# Patient Record
Sex: Male | Born: 1995 | Race: White | Hispanic: No | Marital: Single | Smoking: Current every day smoker
Health system: Southern US, Community
[De-identification: ages and names within clinical notes are randomized; demographics above are authoritative.]

## PROBLEM LIST (undated history)

## (undated) DIAGNOSIS — F1910 Other psychoactive substance abuse, uncomplicated: Secondary | ICD-10-CM

## (undated) DIAGNOSIS — F32A Depression, unspecified: Secondary | ICD-10-CM

## (undated) DIAGNOSIS — F419 Anxiety disorder, unspecified: Secondary | ICD-10-CM

## (undated) DIAGNOSIS — F329 Major depressive disorder, single episode, unspecified: Secondary | ICD-10-CM

---

## 2016-05-28 ENCOUNTER — Encounter (HOSPITAL_COMMUNITY): Payer: Self-pay | Admitting: *Deleted

## 2016-05-28 ENCOUNTER — Emergency Department (HOSPITAL_COMMUNITY)
Admission: EM | Admit: 2016-05-28 | Discharge: 2016-05-29 | Disposition: A | Discharge status: Home / Self Care | Payer: Medicaid Other | Attending: Physician Assistant | Admitting: Physician Assistant

## 2016-05-28 ENCOUNTER — Emergency Department (HOSPITAL_COMMUNITY): Payer: Medicaid Other

## 2016-05-28 DIAGNOSIS — T401X1A Poisoning by heroin, accidental (unintentional), initial encounter: Secondary | ICD-10-CM | POA: Diagnosis present

## 2016-05-28 HISTORY — DX: Other psychoactive substance abuse, uncomplicated: F19.10

## 2016-05-28 LAB — COMPREHENSIVE METABOLIC PANEL
ALK PHOS: 60 U/L (ref 38–126)
ALT: 45 U/L (ref 17–63)
AST: 50 U/L — ABNORMAL HIGH (ref 15–41)
Albumin: 4.1 g/dL (ref 3.5–5.0)
Anion gap: 7 (ref 5–15)
BILIRUBIN TOTAL: 0.4 mg/dL (ref 0.3–1.2)
BUN: 16 mg/dL (ref 6–20)
CO2: 27 mmol/L (ref 22–32)
Calcium: 8.7 mg/dL — ABNORMAL LOW (ref 8.9–10.3)
Chloride: 106 mmol/L (ref 101–111)
Creatinine, Ser: 0.93 mg/dL (ref 0.61–1.24)
GFR calc Af Amer: >60 mL/min (ref >60)
GFR calc non Af Amer: >60 mL/min (ref >60)
GLUCOSE: 150 mg/dL — AB (ref 65–99)
POTASSIUM: 3.4 mmol/L — AB (ref 3.5–5.1)
SODIUM: 140 mmol/L (ref 135–145)
TOTAL PROTEIN: 6.4 g/dL — AB (ref 6.5–8.1)

## 2016-05-28 LAB — RAPID URINE DRUG SCREEN, HOSP PERFORMED
AMPHETAMINES: NOT DETECTED
BARBITURATES: NOT DETECTED
BENZODIAZEPINES: NOT DETECTED
COCAINE: POSITIVE — AB
Opiates: POSITIVE — AB
TETRAHYDROCANNABINOL: POSITIVE — AB

## 2016-05-28 LAB — CBC
HEMATOCRIT: 41.9 % (ref 39.0–52.0)
HEMOGLOBIN: 14.3 g/dL (ref 13.0–17.0)
MCH: 29.1 pg (ref 26.0–34.0)
MCHC: 34.1 g/dL (ref 30.0–36.0)
MCV: 85.2 fL (ref 78.0–100.0)
Platelets: 158 10*3/uL (ref 150–400)
RBC: 4.92 MIL/uL (ref 4.22–5.81)
RDW: 12.9 % (ref 11.5–15.5)
WBC: 11.1 10*3/uL — ABNORMAL HIGH (ref 4.0–10.5)

## 2016-05-28 LAB — ETHANOL: Alcohol, Ethyl (B): <5 mg/dL (ref <5)

## 2016-05-28 LAB — SALICYLATE LEVEL: Salicylate Lvl: <7 mg/dL (ref 2.8–30.0)

## 2016-05-28 LAB — ACETAMINOPHEN LEVEL

## 2016-05-28 MED ORDER — NALOXONE HCL 4 MG/0.1ML NA LIQD
1.0000 | Freq: Once | NASAL | Status: DC
  Filled 2016-05-28: qty 4

## 2016-05-28 MED ORDER — SODIUM CHLORIDE 0.9 % IV BOLUS (SEPSIS)
1000.0000 mL | Freq: Once | INTRAVENOUS | Status: AC
  Administered 2016-05-28: 1000 mL via INTRAVENOUS

## 2016-05-28 NOTE — ED Provider Notes (Signed)
[facility]-EMER[facility]Y DEPT Provider Note   CSN: [id number] Arrival date & time: 05/28/16  1812     History   Chief Complaint Chief Complaint  Patient presents with  . Drug Overdose    HPI Karlyn AgeeJoseph Byrom is a 21 y.o. male.  HPI   Patient is 21 year old male presenting with drug overdose. Patient took on known amount of heroin and snorted it. Patient's girlfriend and found him agonal breathing difficulty , did chest depressions called EMS. On EMS arrival did valve bag mask mask and then gave 2 of Narcan which woke patient up.  Past Medical History:  Diagnosis Date  . Substance abuse     There are no active problems to display for this patient.   No past surgical history on file.     Home Medications    Prior to Admission medications   Not on File    Family History No family history on file.  Social History Social History  Substance Use Topics  . Smoking status: Not on file  . Smokeless tobacco: Not on file  . Alcohol use Not on file     Allergies   Patient has no known allergies.   Review of Systems Review of Systems  Constitutional: Negative for activity change.  Respiratory: Negative for shortness of breath.   Cardiovascular: Negative for chest pain.  Gastrointestinal: Negative for abdominal pain.     Physical Exam Updated Vital Signs BP 124/82 (BP Location: Right Arm)   Pulse 100   Temp 98.1 F (36.7 C) (Oral)   Resp 19   Ht 6\' 1"  (1.854 m)   Wt 79.4 kg (175 lb)   SpO2 98%   BMI 23.09 kg/m   Physical Exam  Constitutional: He is oriented to person, place, and time. He appears well-nourished.  HENT:  Head: Normocephalic.  Eyes: Conjunctivae and EOM are normal. Pupils are equal, round, and reactive to light.  Midsize pupils.  Cardiovascular: Normal rate and regular rhythm.   Pulmonary/Chest: Effort normal and breath sounds normal. No respiratory distress. He has no wheezes.  No signs of chest trauma.  Neurological: He is oriented to  person, place, and time.  Skin: Skin is warm and dry. He is not diaphoretic.  Psychiatric: He has a normal mood and affect. His behavior is normal.     ED Treatments / Results  Labs (all labs ordered are listed, but only abnormal results are displayed) Labs Reviewed  COMPREHENSIVE METABOLIC PANEL - Abnormal; Notable for the following:       Result Value   Potassium 3.4 (*)    Glucose, Bld 150 (*)    Calcium 8.7 (*)    Total Protein 6.4 (*)    AST 50 (*)    All other components within normal limits  ACETAMINOPHEN LEVEL - Abnormal; Notable for the following:    Acetaminophen (Tylenol), Serum <10 (*)    All other components within normal limits  CBC - Abnormal; Notable for the following:    WBC 11.1 (*)    All other components within normal limits  RAPID URINE DRUG SCREEN, HOSP PERFORMED - Abnormal; Notable for the following:    Opiates POSITIVE (*)    Cocaine POSITIVE (*)    Tetrahydrocannabinol POSITIVE (*)    All other components within normal limits  ETHANOL  SALICYLATE LEVEL    EKG  EKG Interpretation None       Radiology Dg Chest 2 View  Result Date: 05/28/2016 CLINICAL DATA:  Chest pain EXAM: CHEST  2 VIEW COMPARISON:  None. FINDINGS: Normal heart size. Normal mediastinal contour. No pneumothorax. No pleural effusion. Lungs appear clear, with no acute consolidative airspace disease and no pulmonary edema. IMPRESSION: No active cardiopulmonary disease. Electronically Signed   By: Delbert Phenix M.D.   On: 05/28/2016 20:58    Procedures Procedures (including critical care time)  Medications Ordered in ED Medications  sodium chloride 0.9 % bolus 1,000 mL (1,000 mLs Intravenous New Bag/Given 05/28/16 1957)     Initial Impression / Assessment and Plan / ED Course  I have reviewed the triage vital signs and the nursing notes.  Pertinent labs & imaging results that were available during my care of the patient were reviewed by me and considered in my medical  decision making (see chart for details).     Patient is a 21 year old male here status post overdose. Patient received 2 of Narcan prior to arrival. Patient is awake alert and conversant. We will observe for 4-6 hours.  10:49 PM   Patient observed for 4 hours, did not require any further narcan.  Will dsicharge home.  Final Clinical Impressions(s) / ED Diagnoses   Final diagnoses:  None    New Prescriptions New Prescriptions   No medications on file     Abelino Derrick, MD 05/28/16 2249

## 2016-05-28 NOTE — Discharge Instructions (Signed)
Please use Narcan given if you have another opiate overdose.  Substance Abuse Treatment Programs  Intensive Outpatient Programs [facility]     [address]      [place], [place]                   [phone]       [facility] [address] [place], [place] [phone]  [facility]     (Inpatient and outpatient)     [address]           [phone]    [facility] [phone] (Suboxone and Methadone)  [address]      [place], [place] [address]      [phone]       [address] [place], [place] [phone]  [facility] (Outpatient/Inpatient, [facility])    (insurance only) [phone]             [facility] (Groups & Residential) [place], [place] [phone]     [facility]     [address]     [place], [place]      [phone]       [facility] (for caregivers and family) [phone] [address] [place], [place] [phone]      Residential Treatment Programs [facility]      [address], [place], [place] [address]  [phone]       [facility] [address] [place], [place] [address] [phone]  [facility]        [phone]       [facility] [phone]  [facility]             [address]                                         [place], [place]                                                [phone] or [phone]                               [facility] [place] [address] [place], [address] [phone]  [facility]    [address]      [place], [place]     [phone]       [facility] [address] [place], [place] [phone]  [facility]   [address]     [place], [place] [address]     [phone]      Admissions: 8am-3pm M-F  [facility]idential  [facility] Services (RTS) [address] [place], [place] [phone]  [facility]: Residential Program [phone](90 Days)   [place], [place]      [phone] or [phone]     [facility]: [facility] [place], [place] (Walk in Hours over the weekend or by referral)  [facility] [address] [place], [place], [place] [address] [phone]  Crisis Mobile: [facility]:  [phone] (for crisis response 24 hours a day) [facility] Hotline:  [phone] Outpatient Psychiatry and Counseling  [facility]: Mobile Crisis Management 24 hours:  [phone]  [facility] of the [employer] sliding scale fee and walk in schedule: M-F 8am-12pm/1pm-3pm [place], [place] [address] [place], [place] [address] [phone]  [facility] (Formerly [facility] as [facility] [employer])- new patient walk-in appointments available Monday - Friday 8am -3pm.          [address] [place], [place] [address] [phone] or crisis line- [place] [facility]/ Intensive Outpatient Therapy Program [place], [place] [address] [place]      [phone] [address] [place], [place] [address]                 [place]   [facility] [phone]. [address] [place], [place] [address]   [employer] [facility] of Care          [address]ette Abraham  [place], [place] [address]       [phone]  [facility] [address], [place], [place] [address] [phone]  [facility]    [address] [place], [place] [address]     [place], [place] Joycelyn Man     [place] [address]     [phone]        [facility] [place] [address]  [facility]     [address], [place], [place], [place] [address] [place]     [address]     [place], [place] [address]     [phone]       [facility] [address] [place], [place] [address] [phone] Resources for Temporary Residential Assistance/Crisis [facility]) M-F 8am-3pm   [address] [place], [place] [address]   [phone] Services include: laundry, barbering, support groups, case management, phone  & computer access, showers, AA/NA mtgs, mental health/substance abuse nurse, job skills class, disability information, VA assistance, spiritual classes, etc.   [facility]   [address] [place]     [place]              [employer] (women and children)       [place]. [place], [place] [address] [phone] [email or web address] for application and process Application Required  [facility]   [address]    [place] [address]     [phone]                    [place], [place] [address] [phone]([email or web address] application appt.) Application Required  [employer] (women only)    [place], [place]  [place]      Intake starts 6pm daily Need valid ID, SSC, & Police report [employer] [address] [place], [place] [phone] Application Required  [employer] (men only)     [place].      [place], [place]       [place] (Pregnant women only) [address] [place], [place]  [facility]      [address]      [place], [place] [address]     [phone]             [facility] [address] [place] [address] [place], [place] 90 day commitment/SA/Application process  [facility](men only)     [address]     [place], [place]       Check-in at [facility] [address] [place], [place] [address] [phone] Men/Women/Women and Children must be there by 7 pm  [place], [place]

## 2016-05-28 NOTE — ED Notes (Signed)
Bed: RESB Expected date:  Expected time:  Means of arrival:  Comments: EMS- heroin OD 

## 2016-05-28 NOTE — ED Triage Notes (Signed)
[facility]  Patient found down after ingestion of heroin.  Patient was 37 days clean.  Patient was agonal and EMS gave narcan.  CPR was initiated and pulses were reestablished. Patient is alert and oriented x4 on arrival to the ED.

## 2016-06-28 ENCOUNTER — Emergency Department (HOSPITAL_COMMUNITY)
Admission: EM | Admit: 2016-06-28 | Discharge: 2016-06-29 | Disposition: A | Discharge status: Home / Self Care | Payer: Self-pay | Attending: Emergency Medicine | Admitting: Emergency Medicine

## 2016-06-28 ENCOUNTER — Encounter (HOSPITAL_COMMUNITY): Payer: Self-pay | Admitting: Emergency Medicine

## 2016-06-28 DIAGNOSIS — F1910 Other psychoactive substance abuse, uncomplicated: Secondary | ICD-10-CM

## 2016-06-28 DIAGNOSIS — R45851 Suicidal ideations: Secondary | ICD-10-CM

## 2016-06-28 DIAGNOSIS — F1920 Other psychoactive substance dependence, uncomplicated: Secondary | ICD-10-CM | POA: Diagnosis present

## 2016-06-28 DIAGNOSIS — F1120 Opioid dependence, uncomplicated: Secondary | ICD-10-CM

## 2016-06-28 DIAGNOSIS — F1210 Cannabis abuse, uncomplicated: Secondary | ICD-10-CM | POA: Insufficient documentation

## 2016-06-28 DIAGNOSIS — F17210 Nicotine dependence, cigarettes, uncomplicated: Secondary | ICD-10-CM | POA: Insufficient documentation

## 2016-06-28 DIAGNOSIS — F1994 Other psychoactive substance use, unspecified with psychoactive substance-induced mood disorder: Secondary | ICD-10-CM

## 2016-06-28 DIAGNOSIS — F1494 Cocaine use, unspecified with cocaine-induced mood disorder: Secondary | ICD-10-CM

## 2016-06-28 DIAGNOSIS — F1110 Opioid abuse, uncomplicated: Secondary | ICD-10-CM | POA: Insufficient documentation

## 2016-06-28 DIAGNOSIS — F1414 Cocaine abuse with cocaine-induced mood disorder: Secondary | ICD-10-CM | POA: Insufficient documentation

## 2016-06-28 HISTORY — DX: Depression, unspecified: F32.A

## 2016-06-28 HISTORY — DX: Anxiety disorder, unspecified: F41.9

## 2016-06-28 HISTORY — DX: Major depressive disorder, single episode, unspecified: F32.9

## 2016-06-28 LAB — COMPREHENSIVE METABOLIC PANEL
ALBUMIN: 4.2 g/dL (ref 3.5–5.0)
ALK PHOS: 45 U/L (ref 38–126)
ALT: 23 U/L (ref 17–63)
AST: 24 U/L (ref 15–41)
Anion gap: 5 (ref 5–15)
BILIRUBIN TOTAL: 1 mg/dL (ref 0.3–1.2)
BUN: 14 mg/dL (ref 6–20)
CALCIUM: 9.3 mg/dL (ref 8.9–10.3)
CO2: 31 mmol/L (ref 22–32)
CREATININE: 0.85 mg/dL (ref 0.61–1.24)
Chloride: 106 mmol/L (ref 101–111)
GFR calc Af Amer: >60 mL/min (ref >60)
GLUCOSE: 120 mg/dL — AB (ref 65–99)
Potassium: 3.6 mmol/L (ref 3.5–5.1)
Sodium: 142 mmol/L (ref 135–145)
TOTAL PROTEIN: 6.3 g/dL — AB (ref 6.5–8.1)

## 2016-06-28 LAB — RAPID URINE DRUG SCREEN, HOSP PERFORMED
Amphetamines: NOT DETECTED
BARBITURATES: NOT DETECTED
BENZODIAZEPINES: NOT DETECTED
Cocaine: POSITIVE — AB
Opiates: POSITIVE — AB
Tetrahydrocannabinol: POSITIVE — AB

## 2016-06-28 LAB — ACETAMINOPHEN LEVEL: Acetaminophen (Tylenol), Serum: <10 ug/mL — ABNORMAL LOW (ref 10–30)

## 2016-06-28 LAB — ETHANOL

## 2016-06-28 LAB — CBC
HEMATOCRIT: 45 % (ref 39.0–52.0)
Hemoglobin: 15.3 g/dL (ref 13.0–17.0)
MCH: 29.2 pg (ref 26.0–34.0)
MCHC: 34 g/dL (ref 30.0–36.0)
MCV: 85.9 fL (ref 78.0–100.0)
Platelets: 177 10*3/uL (ref 150–400)
RBC: 5.24 MIL/uL (ref 4.22–5.81)
RDW: 12.9 % (ref 11.5–15.5)
WBC: 11 10*3/uL — AB (ref 4.0–10.5)

## 2016-06-28 LAB — SALICYLATE LEVEL: Salicylate Lvl: <7 mg/dL (ref 2.8–30.0)

## 2016-06-28 MED ORDER — CLONIDINE HCL 0.1 MG PO TABS
0.1000 mg | ORAL_TABLET | Freq: Four times a day (QID) | ORAL | Status: DC
  Administered 2016-06-28: 0.1 mg via ORAL
  Filled 2016-06-28: qty 1

## 2016-06-28 MED ORDER — ONDANSETRON HCL 4 MG PO TABS: 4.0000 mg | ORAL_TABLET | Freq: Three times a day (TID) | ORAL | Status: DC | PRN

## 2016-06-28 MED ORDER — HYDROXYZINE HCL 25 MG PO TABS: 25.0000 mg | ORAL_TABLET | Freq: Four times a day (QID) | ORAL | Status: DC | PRN

## 2016-06-28 MED ORDER — GABAPENTIN 300 MG PO CAPS
300.0000 mg | ORAL_CAPSULE | Freq: Three times a day (TID) | ORAL | Status: DC
  Administered 2016-06-28 – 2016-06-29 (×4): 300 mg via ORAL
  Filled 2016-06-28 (×4): qty 1

## 2016-06-28 MED ORDER — ONDANSETRON 4 MG PO TBDP: 4.0000 mg | ORAL_TABLET | Freq: Four times a day (QID) | ORAL | Status: DC | PRN

## 2016-06-28 MED ORDER — CLONIDINE HCL 0.1 MG PO TABS: 0.1000 mg | ORAL_TABLET | ORAL | Status: DC

## 2016-06-28 MED ORDER — DICYCLOMINE HCL 20 MG PO TABS: 20.0000 mg | ORAL_TABLET | Freq: Four times a day (QID) | ORAL | Status: DC | PRN

## 2016-06-28 MED ORDER — IBUPROFEN 200 MG PO TABS: 600.0000 mg | ORAL_TABLET | Freq: Three times a day (TID) | ORAL | Status: DC | PRN

## 2016-06-28 MED ORDER — ACETAMINOPHEN 325 MG PO TABS: 650.0000 mg | ORAL_TABLET | ORAL | Status: DC | PRN

## 2016-06-28 MED ORDER — METHOCARBAMOL 500 MG PO TABS: 500.0000 mg | ORAL_TABLET | Freq: Three times a day (TID) | ORAL | Status: DC | PRN

## 2016-06-28 MED ORDER — NAPROXEN 500 MG PO TABS: 500.0000 mg | ORAL_TABLET | Freq: Two times a day (BID) | ORAL | Status: DC | PRN

## 2016-06-28 MED ORDER — CLONIDINE HCL 0.1 MG PO TABS: 0.1000 mg | ORAL_TABLET | Freq: Every day | ORAL | Status: DC

## 2016-06-28 MED ORDER — LOPERAMIDE HCL 2 MG PO CAPS
2.0000 mg | ORAL_CAPSULE | ORAL | Status: DC | PRN
Start: 2016-06-28 — End: 2016-06-29

## 2016-06-28 MED ORDER — CLONIDINE HCL 0.1 MG PO TABS
0.1000 mg | ORAL_TABLET | Freq: Four times a day (QID) | ORAL | Status: DC
  Administered 2016-06-28 – 2016-06-29 (×3): 0.1 mg via ORAL
  Filled 2016-06-28 (×3): qty 1

## 2016-06-28 NOTE — ED Provider Notes (Signed)
[facility] DEPT Provider Note   CSN: [id number] Arrival date & time: 06/28/16  1010     History   Chief Complaint Chief Complaint  Patient presents with  . Suicidal  . Detox    HPI Greg Barber is a 21 y.o. male.  HPI Patient reports he uses both cocaine and heroin. He reports that he is sick of having problems with drug abuse. He reports when he stops using and tries to get off of it he becomes very depressed and start to think about killing himself. He denies any specific plan. He reports he thinks is mostly "drug-induced depression". He reports he is currently living in a halfway house. Past Medical History:  Diagnosis Date  . Anxiety   . Depression   . Substance abuse     There are no active problems to display for this patient.   History reviewed. No pertinent surgical history.     Home Medications    Prior to Admission medications   Not on File    Family History History reviewed. No pertinent family history.  Social History Social History  Substance Use Topics  . Smoking status: Current Every Day Smoker    Packs/day: 1.00    Types: Cigarettes  . Smokeless tobacco: Never Used  . Alcohol use No     Allergies   Azithromycin   Review of Systems Review of Systems 10 Systems reviewed and are negative for acute change except as noted in the HPI.  Physical Exam Updated Vital Signs BP 118/68 (BP Location: Left Arm)   Pulse 77   Temp 98.4 F (36.9 C) (Oral)   Resp 16   Ht 6\' 1"  (1.854 m)   Wt 78.9 kg (174 lb)   SpO2 96%   BMI 22.96 kg/m   Physical Exam  Constitutional: He is oriented to person, place, and time. He appears well-developed and well-nourished.  HENT:  Head: Normocephalic and atraumatic.  Nose: Nose normal.  Mouth/Throat: Oropharynx is clear and moist.  Eyes: Conjunctivae and EOM are normal.  Neck: Neck supple.  Cardiovascular: Normal rate, regular rhythm and intact distal pulses.   No murmur  heard. Pulmonary/Chest: Effort normal and breath sounds normal. No respiratory distress.  Abdominal: Soft. There is no tenderness.  Musculoskeletal: Normal range of motion. He exhibits no edema, tenderness or deformity.  Lower extremities are very good condition. No peripheral edema. No areas of cellulitis or abscess on the extremities.  Neurological: He is alert and oriented to person, place, and time. No cranial nerve deficit. He exhibits normal muscle tone. Coordination normal.  Skin: Skin is warm and dry.  Psychiatric: He has a normal mood and affect.  Nursing note and vitals reviewed.    ED Treatments / Results  Labs (all labs ordered are listed, but only abnormal results are displayed) Labs Reviewed  COMPREHENSIVE METABOLIC PANEL - Abnormal; Notable for the following:       Result Value   Glucose, Bld 120 (*)    Total Protein 6.3 (*)    All other components within normal limits  ACETAMINOPHEN LEVEL - Abnormal; Notable for the following:    Acetaminophen (Tylenol), Serum <10 (*)    All other components within normal limits  CBC - Abnormal; Notable for the following:    WBC 11.0 (*)    All other components within normal limits  RAPID URINE DRUG SCREEN, HOSP PERFORMED - Abnormal; Notable for the following:    Opiates POSITIVE (*)    Cocaine POSITIVE (*)  Tetrahydrocannabinol POSITIVE (*)    All other components within normal limits  ETHANOL  SALICYLATE LEVEL    EKG  EKG Interpretation None       Radiology No results found.  Procedures Procedures (including critical care time)  Medications Ordered in ED Medications  acetaminophen (TYLENOL) tablet 650 mg (not administered)  ibuprofen (ADVIL,MOTRIN) tablet 600 mg (not administered)  ondansetron (ZOFRAN) tablet 4 mg (not administered)     Initial Impression / Assessment and Plan / ED Course  I have reviewed the triage vital signs and the nursing notes.  Pertinent labs & imaging results that were available  during my care of the patient were reviewed by me and considered in my medical decision making (see chart for details).     Final Clinical Impressions(s) / ED Diagnoses   Final diagnoses:  Cocaine-induced mood disorder (HCC)  Suicidal ideation  Polysubstance abuse  Heroin abuse   Patient is medically cleared. At this time his mental status is clear. Is not showing any signs of acute psychosis or active withdrawal. Appropriate for disposition per TTS consult. New Prescriptions New Prescriptions   No medications on file     Arby Barrette, MD 06/28/16 [phone]

## 2016-06-28 NOTE — ED Notes (Signed)
Pt admitted to room #39. Pt reports wanting rehab for "drug addiction." Pt endorsing SI. Not endorsing HI/AVH. Encouragement and support provided. Special checks q 15 mins in place for safety, video monitoring in place. Will continue to monitor.

## 2016-06-28 NOTE — BH Assessment (Signed)
[facility] Assessment Progress Note   Case was staffed with Lord DNP who recommended patient be re-evaluated in the a.m.    

## 2016-06-28 NOTE — ED Notes (Signed)
SBAR Report received from previous nurse. Pt received calm and visible on unit. Pt asleep and gave no answers to assessment questions related to  current SI/ HI, A/V H, depression, anxiety, or pain at this time, and appears otherwise stable and free of distress. Pt reminded of camera surveillance, q 15 min rounds, and rules of the milieu. Will continue to assess.

## 2016-06-28 NOTE — BH Assessment (Addendum)
Assessment Note  Greg Barber is an 21 y.o. male that presents this date with thoughts of self harm but denies any plan or intent. Patient is a poor historian and is vague in reference to his plan of self harm. Patient reports he uses both cocaine and heroin. Patient states he has been using 1 gram of Heroin daily for the last year reporting withdrawals to include tremors and agitation. Patient reports last use on 06/27/16 when patient stated he used one gram. Patient denies any IV drug use and reports he currently "snorts" the heroin. Patient also reports daily cocaine use stating he uses 1/2 gram of cocaine (crack) daily for the last month. Patient reports last use on 06/27/16 when patient reported he used 1 gram. Patient states he has been maintaining his sobriety for the last four months while residing at a halfway ([facility]). Patient denies any other illicit SA use. Patient denies any other illicit SA use. Patient denies any prior inpatient admissions or previous attempts/gestures at self harm. Patient denies any OP treatment or prior MH diagnosis although reports ongoing depression with symptoms to include excessive guilt and feelings of being hopelessness. Patient cannot identify any current stressors with his relapse. Patient did state that he was living in a high risk environment conducive to relapse. Patient denies any H/I or AVH. Patient is oriented to time/place but is very drowsy during assessment being partially impaired. Patient is requesting a voluntary admission to assist with detox and stabilization. Per notes, patient reports when he stops using and tries to get off of it he becomes very depressed and start to think about killing himself. He denies any specific plan. He reports he thinks is mostly "drug-induced depression". He reports he is currently living in a halfway house. Pt presents with SI and detox states"sick and tired of being sick and tired" I could hurt myself if I don't get  the help I need and detox. I just need somewhere to be. Case was staffed with Shaune Pollack DNP who recommended patient be re-evaluated in the a.m.  Diagnosis: MDD recurrent without psychotic features, Polysubstance abuse severe  Past Medical History:  Past Medical History:  Diagnosis Date  . Anxiety   . Depression   . Substance abuse     History reviewed. No pertinent surgical history.  Family History: History reviewed. No pertinent family history.  Social History:  reports that he has been smoking Cigarettes.  He has been smoking about 1.00 pack per day. He has never used smokeless tobacco. He reports that he uses drugs, including Cocaine. He reports that he does not drink alcohol.  Additional Social History:  Alcohol / Drug Use Pain Medications: See MAR Prescriptions: See MAR Over the Counter: See MAR History of alcohol / drug use?: Yes Longest period of sobriety (when/how long): 2 months Negative Consequences of Use: Personal relationships Withdrawal Symptoms: Agitation, Tremors Substance #1 Name of Substance 1: Heroin 1 - Age of First Use: 19 1 - Amount (size/oz): 1/2 a gram 1 - Frequency: Daily 1 - Duration: Last year  1 - Last Use / Amount: 06/27/16 1/2 gram Substance #2 Name of Substance 2: Cocaine (Crack) 2 - Age of First Use: 19 2 - Amount (size/oz): 1/3 gram 2 - Frequency: Daily 2 - Duration: Last year 2 - Last Use / Amount: 06/27/16 1/2 gram  CIWA: CIWA-Ar BP: 120/62 Pulse Rate: 79 COWS:    Allergies:  Allergies  Allergen Reactions  . Azithromycin Hives    Home  Medications:  (Not in a hospital admission)  OB/GYN Status:  No LMP for male patient.  General Assessment Data Location of Assessment: [facility] TTS Assessment: In system Is this a Tele or Face-to-Face Assessment?: Face-to-Face Is this an Initial Assessment or a Re-assessment for this encounter?: Initial Assessment Marital status: Single Maiden name: NA Is patient pregnant?: No Pregnancy Status:  No Living Arrangements: Other (Comment) ([facility]) Can pt return to current living arrangement?: Yes Admission Status: Voluntary Is patient capable of signing voluntary admission?: Yes Referral Source: Self/Family/Friend Insurance type: Medicaid  Medical Screening Exam [facility] Walk-in ONLY) Medical Exam completed: Yes  Crisis Care Plan Living Arrangements: Other (Comment) ([facility]) Legal Guardian:  (NA) Name of Psychiatrist: None Name of Therapist: None  Education Status Is patient currently in school?: No Current Grade:  (NA) Highest grade of school patient has completed:  (12) Name of school:  (NA) Contact person:  (NA)  Risk to self with the past 6 months Suicidal Ideation: Yes-Currently Present Has patient been a risk to self within the past 6 months prior to admission? : No Suicidal Intent: Yes-Currently Present Has patient had any suicidal intent within the past 6 months prior to admission? : No Is patient at risk for suicide?: No Suicidal Plan?: No Has patient had any suicidal plan within the past 6 months prior to admission? : No Access to Means: No What has been your use of drugs/alcohol within the last 12 months?: Current use Previous Attempts/Gestures: No How many times?: 0 Other Self Harm Risks: NA Triggers for Past Attempts: Unknown Intentional Self Injurious Behavior: None Family Suicide History: No Recent stressful life event(s): Other (Comment) (Recent relapse) Persecutory voices/beliefs?: No Depression: Yes Depression Symptoms: Guilt, Feeling worthless/self pity Substance abuse history and/or treatment for substance abuse?: Yes Suicide prevention information given to non-admitted patients: Not applicable  Risk to Others within the past 6 months Homicidal Ideation: No Does patient have any lifetime risk of violence toward others beyond the six months prior to admission? : No Thoughts of Harm to Others: No Current Homicidal Intent:  No Current Homicidal Plan: No Access to Homicidal Means: No Identified Victim: NA History of harm to others?: No Assessment of Violence: None Noted Violent Behavior Description: NA Does patient have access to weapons?: No Criminal Charges Pending?: No Does patient have a court date: No Is patient on probation?: No  Psychosis Hallucinations: None noted Delusions: None noted  Mental Status Report Appearance/Hygiene: In scrubs Eye Contact: Poor Motor Activity: Unremarkable Speech: Slow, Slurred Level of Consciousness: Drowsy Mood: Depressed Affect: Blunted, Depressed Anxiety Level: Minimal Thought Processes: Coherent Judgement: Partial Orientation: Place Obsessive Compulsive Thoughts/Behaviors: None  Cognitive Functioning Concentration: Decreased Memory: Recent Intact IQ: Average Insight: Fair Impulse Control: Poor Appetite: Fair Weight Loss: 0 Weight Gain: 0 Sleep: Decreased Total Hours of Sleep: 5 Vegetative Symptoms: None  ADLScreening [facility] Assessment Services) Patient's cognitive ability adequate to safely complete daily activities?: Yes Patient able to express need for assistance with ADLs?: Yes Independently performs ADLs?: Yes (appropriate for developmental age)  Prior Inpatient Therapy Prior Inpatient Therapy: No Prior Therapy Dates: NA Prior Therapy Facilty/Provider(s): NA Reason for Treatment: NA  Prior Outpatient Therapy Prior Outpatient Therapy: No Prior Therapy Dates: NA Prior Therapy Facilty/Provider(s): NA Reason for Treatment: NA Does patient have an ACCT team?: No Does patient have Intensive In-House Services?  : No Does patient have [facility] services? : No Does patient have P4CC services?: No  ADL Screening (condition at time of admission)  Patient's cognitive ability adequate to safely complete daily activities?: Yes Is the patient deaf or have difficulty hearing?: No Does the patient have difficulty seeing, even when wearing  glasses/contacts?: No Does the patient have difficulty concentrating, remembering, or making decisions?: No Patient able to express need for assistance with ADLs?: Yes Does the patient have difficulty dressing or bathing?: No Independently performs ADLs?: Yes (appropriate for developmental age) Does the patient have difficulty walking or climbing stairs?: No Weakness of Legs: None Weakness of Arms/Hands: None  Home Assistive Devices/Equipment Home Assistive Devices/Equipment: None  Therapy Consults (therapy consults require a physician order) PT Evaluation Needed: No OT Evalulation Needed: No SLP Evaluation Needed: No Abuse/Neglect Assessment (Assessment to be complete while patient is alone) Physical Abuse: Denies Verbal Abuse: Denies Sexual Abuse: Denies Exploitation of patient/patient's resources: Denies Self-Neglect: Denies Values / Beliefs Cultural Requests During Hospitalization: None Spiritual Requests During Hospitalization: None Consults Spiritual Care Consult Needed: No Social Work Consult Needed: No Merchant navy officer (For Healthcare) Does Patient Have a Medical Advance Directive?: No Would patient like information on creating a medical advance directive?: No - Patient declined    Additional Information 1:1 In Past 12 Months?: No CIRT Risk: No Elopement Risk: No Does patient have medical clearance?: Yes     Disposition: Case was staffed with Shaune Pollack DNP who recommended patient be re-evaluated in the a.m.   Disposition Initial Assessment Completed for this Encounter: Yes Disposition of Patient: Other dispositions Other disposition(s): Other (Comment) (Re-evaluate in the a.m.)  On Site Evaluation by:   Reviewed with Physician:    Alfredia Ferguson 06/28/2016 2:56 PM

## 2016-06-28 NOTE — ED Triage Notes (Signed)
Pt presents with SI and Detox states"sick and tired of being sick and tired" I could hurt myself if I don't get the help I need and detox. I just need somewhere to be. Lorette Angavid Psych Counselor at bedside during triage.  Uses dope and snorting heroin, smoking rock. Denies IV drug use, or HI. He states he was in a half way house and relapsed. Denies a formulated plan for SI. Pt NAD at triage.

## 2016-06-28 NOTE — ED Notes (Signed)
ED Provider at bedside. 

## 2016-06-29 ENCOUNTER — Encounter (HOSPITAL_COMMUNITY): Payer: Self-pay | Admitting: Registered Nurse

## 2016-06-29 DIAGNOSIS — Z79899 Other long term (current) drug therapy: Secondary | ICD-10-CM

## 2016-06-29 DIAGNOSIS — F17210 Nicotine dependence, cigarettes, uncomplicated: Secondary | ICD-10-CM

## 2016-06-29 DIAGNOSIS — Z881 Allergy status to other antibiotic agents status: Secondary | ICD-10-CM

## 2016-06-29 DIAGNOSIS — F1994 Other psychoactive substance use, unspecified with psychoactive substance-induced mood disorder: Secondary | ICD-10-CM

## 2016-06-29 NOTE — Discharge Instructions (Signed)
For your ongoing mental health needs, you are advised to follow up [facility]. ° °[facility] ([facility]) °[address], [address] ° °[place], [place] [address] °[phone] °New patients are seen at the walk-in clinic every Tuesday from 9:00 am - 12:00 pm. °

## 2016-06-29 NOTE — Consult Note (Signed)
[name]   Reason for Consult:  Self harming thoughts Referring Physician:  EDP Patient Identification: Greg Barber MRN:  [id number] Principal Diagnosis: Substance induced mood disorder ([place]) Diagnosis:   Patient Active Problem List   Diagnosis Date Noted  . Polysubstance (including opioids) dependence, daily use ([place]) [F19.20] 06/28/2016  . Substance induced mood disorder [facility]) [F19.94] 06/28/2016    Total Time spent with patient: 30 minutes  Subjective:   Greg Barber is a 21 y.o. male patient presents to [facility] with self harming thoughts no plan.   Patient reports last use on 06/27/16 when patient stated he used one gram. Patient    HPI:  Patient reports that he is seeking help for substance abuse states that he is staying in [place] (transitional housing).  He denies any IV drug use and reports he currently "snorts" the heroin. Patient also reports daily cocaine use stating he uses 1/2 gram of cocaine (crack) daily for the last month. Patient reports last use on 06/27/16 when patient reported he used 1 gram.  Today states that he is feeling better and is ready to go home.  Patient denies suicidal/homicidal ideation, self harming thoughts, psychosis, and paranoia.  Reports that he is [facility] to [facility].    Past Psychiatric History: Polysubstance abuse/dependence, Substance induce mood disorder  Risk to Self: Suicidal Ideation: Not Currently Present Suicidal Intent: Not Currently Present Is patient at risk for suicide?: No Suicidal Plan?: No Access to Means: No What has been your use of drugs/alcohol within the last 12 months?: Current use How many times?: 0 Other Self Harm Risks: NA Triggers for Past Attempts: Unknown Intentional Self Injurious Behavior: None Risk to Others: Homicidal Ideation: No Thoughts of Harm to Others: No Current Homicidal Intent: No Current Homicidal Plan: No Access to Homicidal Means:  No Identified Victim: NA History of harm to others?: No Assessment of Violence: None Noted Violent Behavior Description: NA Does patient have access to weapons?: No Criminal Charges Pending?: No Does patient have a court date: No Prior Inpatient Therapy: Prior Inpatient Therapy: No Prior Therapy Dates: NA Prior Therapy Facilty/Provider(s): NA Reason for Treatment: NA Prior Outpatient Therapy: Prior Outpatient Therapy: No Prior Therapy Dates: NA Prior Therapy Facilty/Provider(s): NA Reason for Treatment: NA Does patient have an ACCT team?: No Does patient have Intensive In-House Services?  : No Does patient have [facility] services? : No Does patient have P4CC services?: No  Past Medical History:  Past Medical History:  Diagnosis Date  . Anxiety   . Depression   . Substance abuse    History reviewed. No pertinent surgical history. Family History: History reviewed. No pertinent family history. Family Psychiatric  History: Unaware Social History:  History  Alcohol Use No     History  Drug Use  . Types: Cocaine    Comment: HEROIN    Social History   Social History  . Marital status: Single    Spouse name: N/A  . Number of children: N/A  . Years of education: N/A   Social History Main Topics  . Smoking status: Current Every Day Smoker    Packs/day: 1.00    Types: Cigarettes  . Smokeless tobacco: Never Used  . Alcohol use No  . Drug use: Yes    Types: Cocaine     Comment: HEROIN  . Sexual activity: Yes    Birth control/ protection: None   Other Topics Concern  . None   Social History Narrative  .  None   Additional Social History:    Allergies:   Allergies  Allergen Reactions  . Azithromycin Hives    Labs:  Results for orders placed or performed during the hospital encounter of 06/28/16 (from the past 48 hour(s))  Comprehensive metabolic panel     Status: Abnormal   Collection Time: 06/28/16 11:09 AM  Result Value Ref Range   Sodium 142 135 - 145  mmol/L   Potassium 3.6 3.5 - 5.1 mmol/L   Chloride 106 101 - 111 mmol/L   CO2 31 22 - 32 mmol/L   Glucose, Bld 120 (H) 65 - 99 mg/dL   BUN 14 6 - 20 mg/dL   Creatinine, Ser 0.85 0.61 - 1.24 mg/dL   Calcium 9.3 8.9 - 10.3 mg/dL   Total Protein 6.3 (L) 6.5 - 8.1 g/dL   Albumin 4.2 3.5 - 5.0 g/dL   AST 24 15 - 41 U/L   ALT 23 17 - 63 U/L   Alkaline Phosphatase 45 38 - 126 U/L   Total Bilirubin 1.0 0.3 - 1.2 mg/dL   GFR calc non Af Amer >60 >60 mL/min   GFR calc Af Amer >60 >60 mL/min    Comment: (NOTE) The eGFR has been calculated using the CKD EPI equation. This calculation has not been validated in all clinical situations. eGFR's persistently <60 mL/min signify possible Chronic Kidney Disease.    Anion gap 5 5 - 15  Ethanol     Status: None   Collection Time: 06/28/16 11:09 AM  Result Value Ref Range   Alcohol, Ethyl (B) <5 <5 mg/dL    Comment:        LOWEST DETECTABLE LIMIT FOR SERUM ALCOHOL IS 5 mg/dL FOR MEDICAL PURPOSES ONLY   Salicylate level     Status: None   Collection Time: 06/28/16 11:09 AM  Result Value Ref Range   Salicylate Lvl <3.2 2.8 - 30.0 mg/dL  Acetaminophen level     Status: Abnormal   Collection Time: 06/28/16 11:09 AM  Result Value Ref Range   Acetaminophen (Tylenol), Serum <10 (L) 10 - 30 ug/mL    Comment:        THERAPEUTIC CONCENTRATIONS VARY SIGNIFICANTLY. A RANGE OF 10-30 ug/mL MAY BE AN EFFECTIVE CONCENTRATION FOR MANY PATIENTS. HOWEVER, SOME ARE BEST TREATED AT CONCENTRATIONS OUTSIDE THIS RANGE. ACETAMINOPHEN CONCENTRATIONS >150 ug/mL AT 4 HOURS AFTER INGESTION AND >50 ug/mL AT 12 HOURS AFTER INGESTION ARE OFTEN ASSOCIATED WITH TOXIC REACTIONS.   cbc     Status: Abnormal   Collection Time: 06/28/16 11:09 AM  Result Value Ref Range   WBC 11.0 (H) 4.0 - 10.5 K/uL   RBC 5.24 4.22 - 5.81 MIL/uL   Hemoglobin 15.3 13.0 - 17.0 g/dL   HCT 45.0 39.0 - 52.0 %   MCV 85.9 78.0 - 100.0 fL   MCH 29.2 26.0 - 34.0 pg   MCHC 34.0 30.0 - 36.0  g/dL   RDW 12.9 11.5 - 15.5 %   Platelets 177 150 - 400 K/uL  Rapid urine drug screen (hospital performed)     Status: Abnormal   Collection Time: 06/28/16 11:09 AM  Result Value Ref Range   Opiates POSITIVE (A) NONE DETECTED   Cocaine POSITIVE (A) NONE DETECTED   Benzodiazepines NONE DETECTED NONE DETECTED   Amphetamines NONE DETECTED NONE DETECTED   Tetrahydrocannabinol POSITIVE (A) NONE DETECTED   Barbiturates NONE DETECTED NONE DETECTED    Comment:        DRUG SCREEN FOR MEDICAL PURPOSES  ONLY.  IF CONFIRMATION IS NEEDED FOR ANY PURPOSE, NOTIFY LAB WITHIN 5 DAYS.        LOWEST DETECTABLE LIMITS FOR URINE DRUG SCREEN Drug Class       Cutoff (ng/mL) Amphetamine      1000 Barbiturate      200 Benzodiazepine   412 Tricyclics       878 Opiates          300 Cocaine          300 THC              50     Current Facility-Administered Medications  Medication Dose Route Frequency Provider Last Rate Last Dose  . acetaminophen (TYLENOL) tablet 650 mg  650 mg Oral Q4H PRN Charlesetta Shanks, MD      . cloNIDine (CATAPRES) tablet 0.1 mg  0.1 mg Oral QID Charlesetta Shanks, MD   0.1 mg at 06/29/16 0931   Followed by  . [START ON 07/01/2016] cloNIDine (CATAPRES) tablet 0.1 mg  0.1 mg Oral Adela Lank, MD       Followed by  . [START ON 07/03/2016] cloNIDine (CATAPRES) tablet 0.1 mg  0.1 mg Oral QAC breakfast Charlesetta Shanks, MD      . dicyclomine (BENTYL) tablet 20 mg  20 mg Oral Q6H PRN Ethelene Hal, NP      . gabapentin (NEURONTIN) capsule 300 mg  300 mg Oral TID Ethelene Hal, NP   300 mg at 06/29/16 6767  . hydrOXYzine (ATARAX/VISTARIL) tablet 25 mg  25 mg Oral Q6H PRN Ethelene Hal, NP      . ibuprofen (ADVIL,MOTRIN) tablet 600 mg  600 mg Oral Q8H PRN Charlesetta Shanks, MD      . loperamide (IMODIUM) capsule 2-4 mg  2-4 mg Oral PRN Ethelene Hal, NP      . methocarbamol (ROBAXIN) tablet 500 mg  500 mg Oral Q8H PRN Ethelene Hal, NP      .  naproxen (NAPROSYN) tablet 500 mg  500 mg Oral BID PRN Ethelene Hal, NP      . ondansetron [facility]) tablet 4 mg  4 mg Oral Q8H PRN Charlesetta Shanks, MD      . ondansetron (ZOFRAN-ODT) disintegrating tablet 4 mg  4 mg Oral Q6H PRN Ethelene Hal, NP       No current outpatient prescriptions on file.    Musculoskeletal: Strength & Muscle Tone: within normal limits Gait & Station: normal Patient leans: N/A  Psychiatric Specialty Exam: Physical Exam  Nursing note and vitals reviewed. Neck: Normal range of motion.  Respiratory: Effort normal.  Musculoskeletal: Normal range of motion.  Neurological: He is alert.    Review of Systems  Psychiatric/Behavioral: Positive for depression (Stable) and substance abuse. Negative for hallucinations and memory loss. The patient is not nervous/anxious and does not have insomnia.   All other systems reviewed and are negative.   Blood pressure 103/74, pulse 74, temperature 98.6 F (37 C), temperature source Oral, resp. rate 16, height '6\' 1"'$  (1.854 m), weight 78.9 kg (174 lb), SpO2 99 %.Body mass index is 22.96 kg/m.  General Appearance: Casual  Eye Contact:  Good  Speech:  Clear and Coherent and Normal Rate  Volume:  Normal  Mood:  "Good"  Affect:  Appropriate  Thought Process:  Coherent  Orientation:  Full (Time, Place, and Person)  Thought Content:  Logical and Denies hallucinations, delusions, and paranoia  Suicidal Thoughts:  No  Homicidal Thoughts:  No  Memory:  Immediate;   Good Recent;   Good Remote;   Good  Judgement:  Fair  Insight:  Present  Psychomotor Activity:  Normal  Concentration:  Concentration: Good and Attention Span: Good  Recall:  Good  Fund of Knowledge:  Fair  Language:  Good  Akathisia:  No  Handed:  Right  AIMS (if indicated):     Assets:  Communication Skills Desire for Improvement Housing  ADL's:  Intact  Cognition:  WNL  Sleep:        Treatment Plan Summary: Plan Discharge home to  follow up with [facility]  Disposition: No evidence of imminent risk to self or others at present.   Patient does not meet criteria for psychiatric inpatient admission.  Rankin, Shuvon, NP 06/29/2016 11:22 AM  Patient seen face-to-face for psychiatric evaluation, chart reviewed and case discussed with the physician extender and developed treatment plan. Reviewed the information documented and agree with the treatment plan. Corena Pilgrim, MD

## 2016-06-29 NOTE — [facility] Suicide Risk Assessment (Cosign Needed)
Suicide Risk Assessment  Discharge Assessment   [facility]   Principal Problem: Substance induced mood disorder [facility](HCC) Discharge Diagnoses:  Patient Active Problem List   Diagnosis Date Noted  . Polysubstance (including opioids) dependence, daily use ([facility]) [F19.20] 06/28/2016  . Substance induced mood disorder ([facility]) [F19.94] 06/28/2016    Total Time spent with patient: 30 minutes  Musculoskeletal: Strength & Muscle Tone: within normal limits Gait & Station: normal Patient leans: N/A  Psychiatric Specialty Exam:   Blood pressure 103/74, pulse 74, temperature 98.6 F (37 C), temperature source Oral, resp. rate 16, height 6\' 1"  (1.854 m), weight 78.9 kg (174 lb), SpO2 99 %.Body mass index is 22.96 kg/m.   General Appearance: Casual  Eye Contact:  Good  Speech:  Clear and Coherent and Normal Rate  Volume:  Normal  Mood:  "Good"  Affect:  Appropriate  Thought Process:  Coherent  Orientation:  Full (Time, Place, and Person)  Thought Content:  Logical and Denies hallucinations, delusions, and paranoia  Suicidal Thoughts:  No  Homicidal Thoughts:  No  Memory:  Immediate;   Good Recent;   Good Remote;   Good  Judgement:  Fair  Insight:  Present  Psychomotor Activity:  Normal  Concentration:  Concentration: Good and Attention Span: Good  Recall:  Good  Fund of Knowledge:  Fair  Language:  Good  Akathisia:  No  Handed:  Right  AIMS (if indicated):     Assets:  Communication Skills Desire for Improvement Housing  ADL's:  Intact  Cognition:  WNL  Sleep:        Mental Status Per Nursing Assessment::   On Admission:   Self harming thoughts  Demographic Factors:  Male and Caucasian  Loss Factors: NA  Historical Factors: Impulsivity  Risk Reduction Factors:   Positive social support  Continued Clinical Symptoms:  Alcohol/Substance Abuse/Dependencies  Cognitive Features That Contribute To Risk:  None    Suicide Risk:  Minimal:  No identifiable suicidal ideation.  Patients presenting with no risk factors but with morbid ruminations; may be classified as minimal risk based on the severity of the depressive symptoms    Plan Of Care/Follow-up recommendations:  Activity:  As tolerated Diet:  Heart healthy  Follow up with [facility]  Rankin, Shuvon, NP 06/29/2016, 11:51 AM

## 2016-06-29 NOTE — ED Notes (Signed)
Pt d/c home per MD order. Discharge summary reviewed with pt. Pt verbalizes understanding. Pt denies SI/HI/AVH. Pt signed for personal property and property returned. Pt given bus pass per his request. Pt signed e-signature. Ambulatory off unit with [facility].

## 2018-09-26 IMAGING — CR DG CHEST 2V
2 series · 2 of 2 positions shown · non-contrast
Comparison: None.

CLINICAL DATA: Chest pain

EXAM:
CHEST  2 VIEW

[w chest lat]
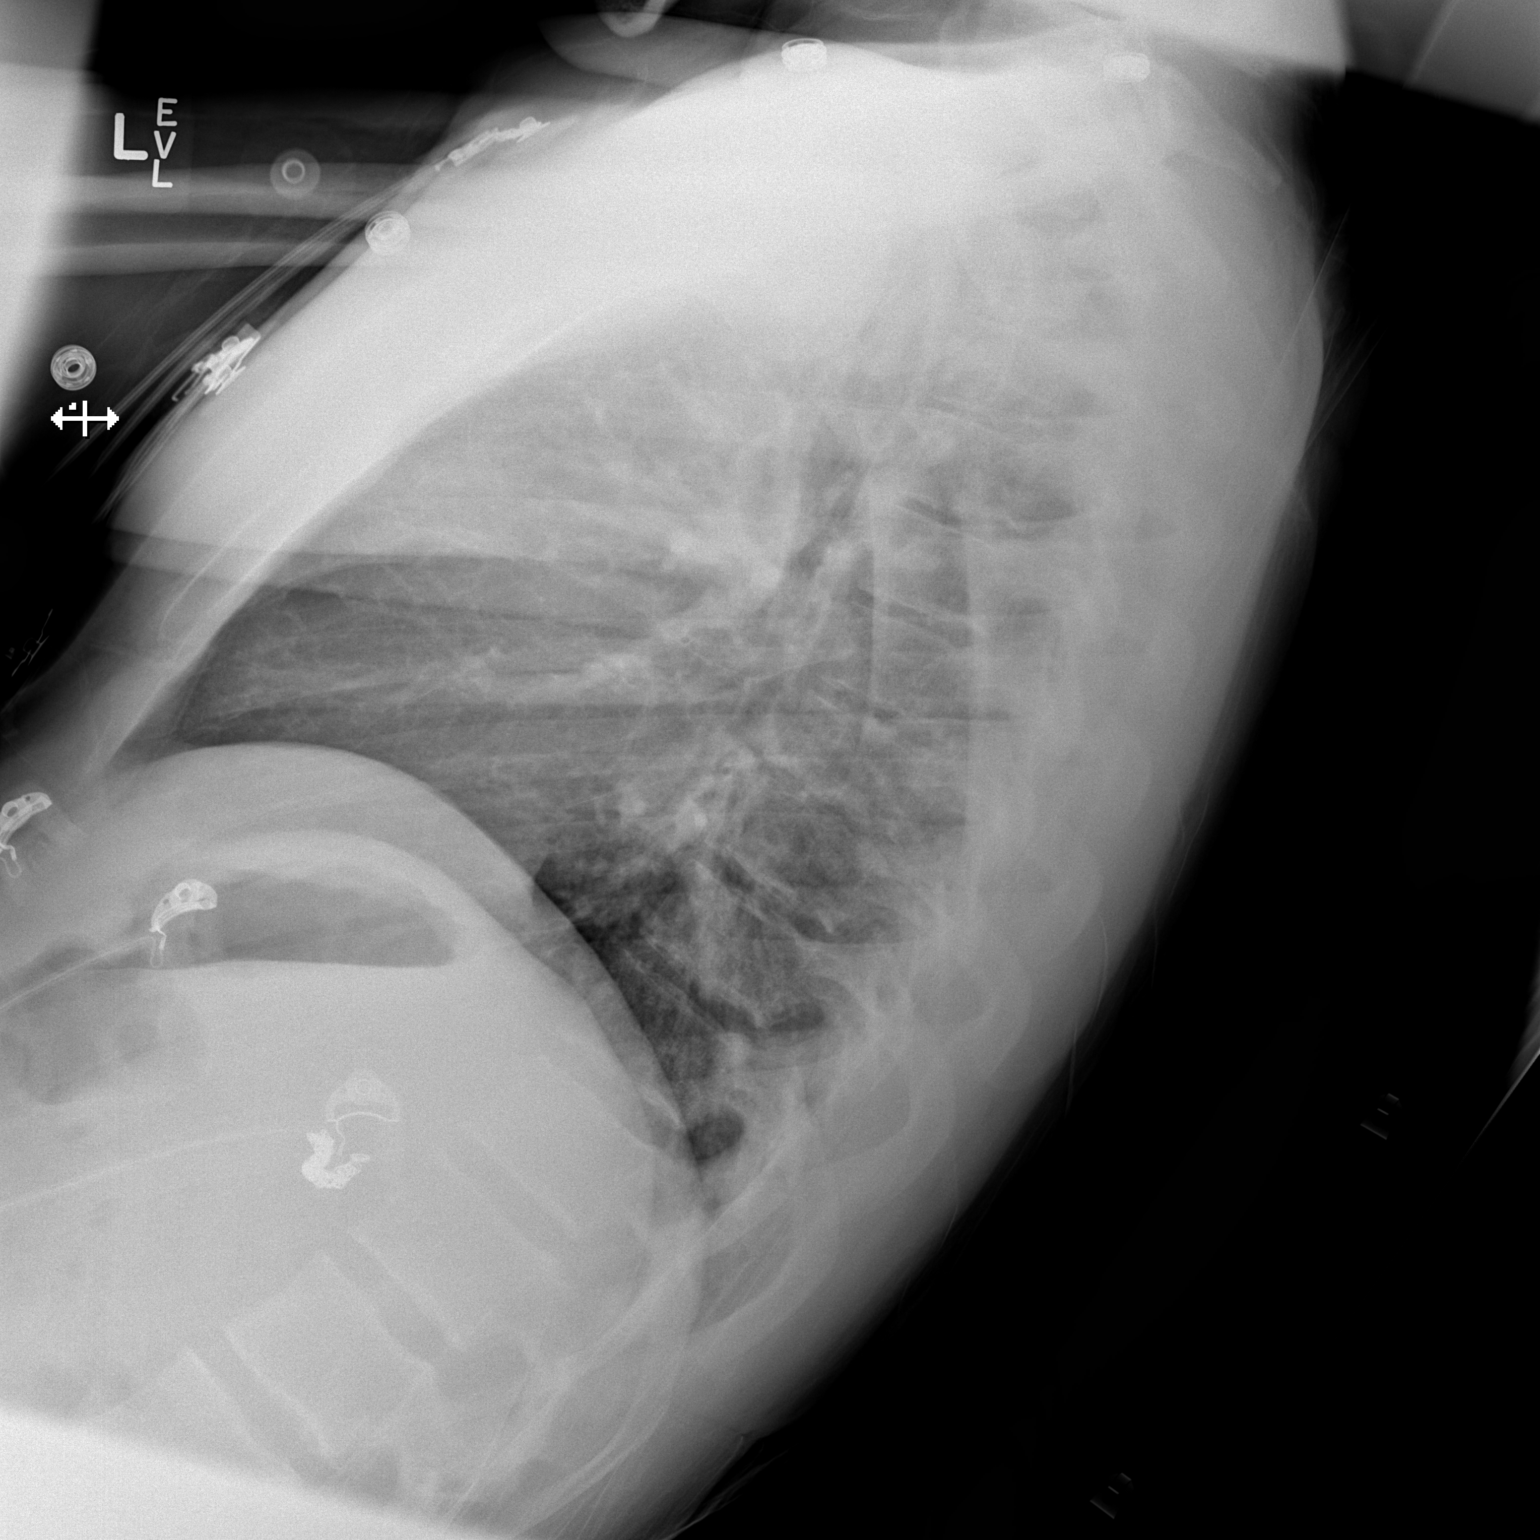

[x chest ap]
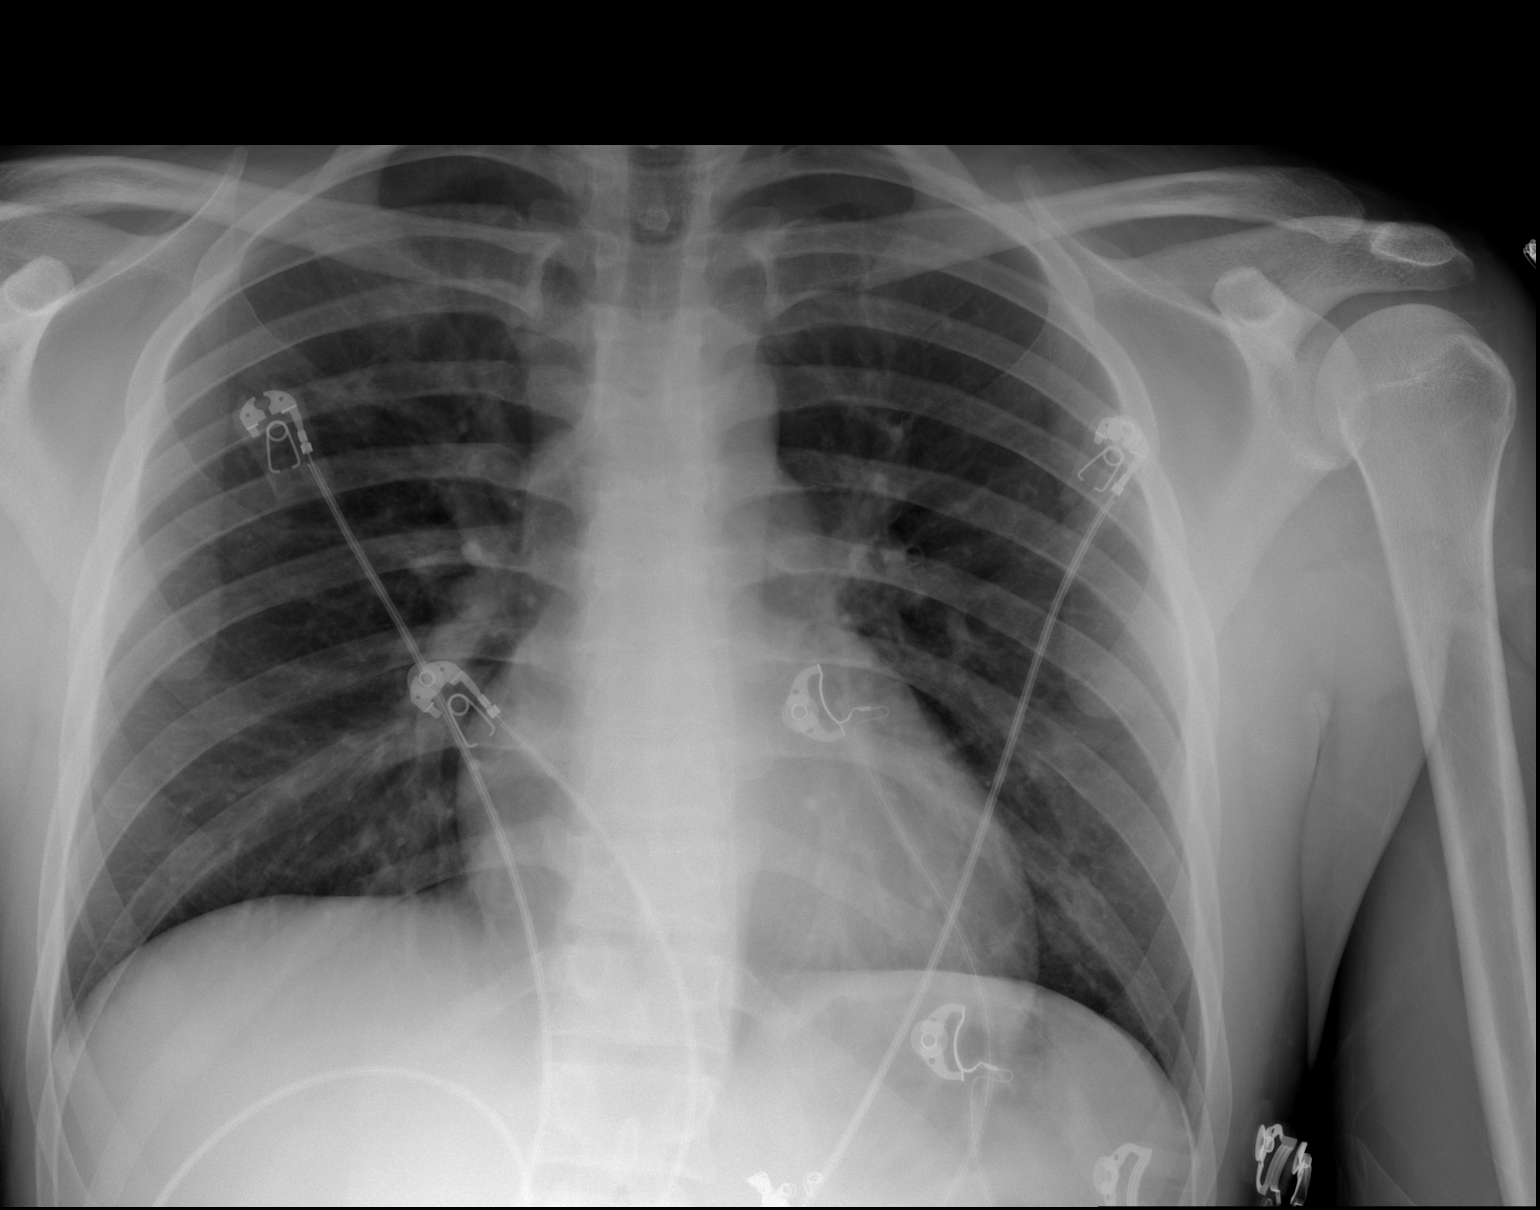

[2 of 2 positions shown; findings below may reference images not displayed]

FINDINGS: Normal heart size. Normal mediastinal contour. No pneumothorax. No
pleural effusion. Lungs appear clear, with no acute consolidative
airspace disease and no pulmonary edema.
IMPRESSION: No active cardiopulmonary disease.
